# Patient Record
Sex: Male | Born: 2000 | Hispanic: Yes | Marital: Single | Smoking: Never smoker
Health system: Southern US, Community
[De-identification: ages and names within clinical notes are randomized; demographics above are authoritative.]

## PROBLEM LIST (undated history)

## (undated) HISTORY — PX: APPENDECTOMY: SHX54

---
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
## 2020-04-22 IMAGING — CT CT KNEE*L* W/O CM
3 series · 12 of 33 positions shown, 14 images · non-contrast
Comparison: Plain films left knee today.

CLINICAL DATA: The patient felt a pop in the left knee today while
playing soccer with onset of pain. Initial encounter.

EXAM:
CT OF THE LEFT KNEE WITHOUT CONTRAST
TECHNIQUE: Multidetector CT imaging of the left knee was performed according to
the standard protocol. Multiplanar CT image reconstructions were
also generated.

[Series 7: ax axial st · axial · 0.32mm/px · z∈[+338,+499]mm · 4 of 155 slices shown, 5 images]
[im 24/155  soft-tissue]
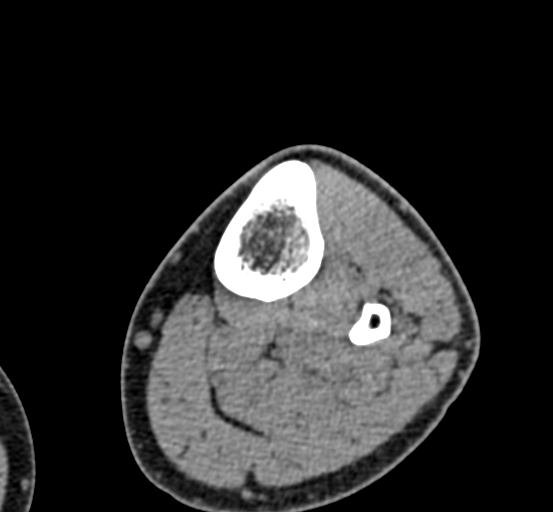
[im 24/155  bone]
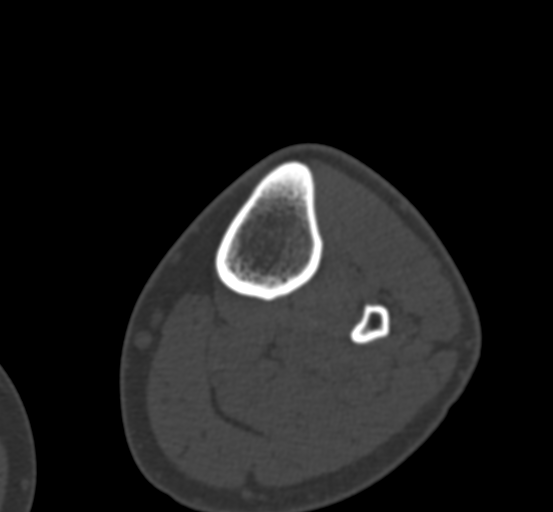
[im 60/155  bone]
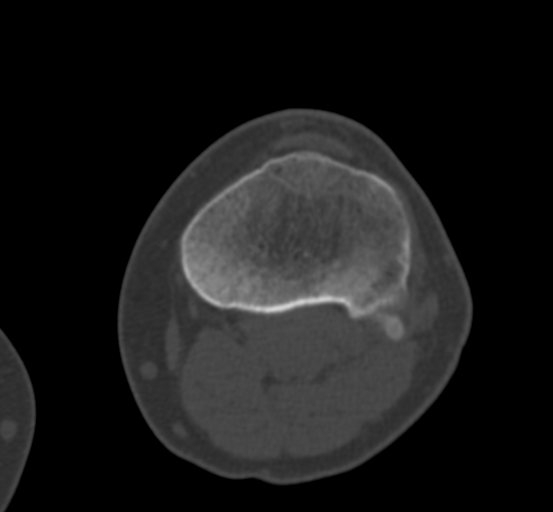
[im 95/155  bone]
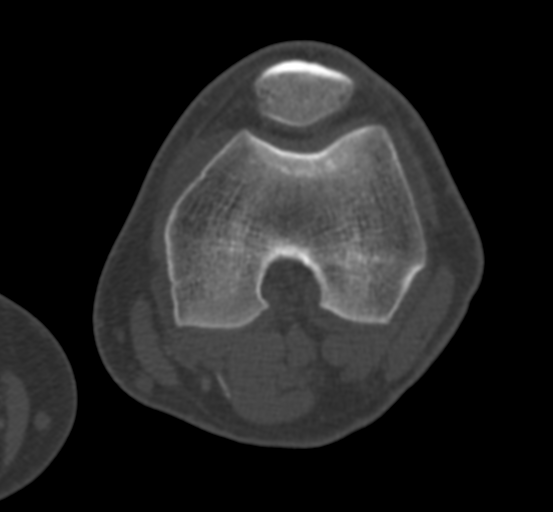
[im 131/155  bone]
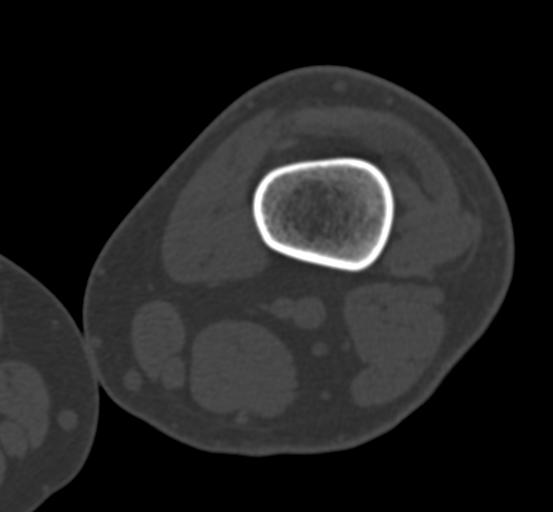

[Series 8: coronal st · coronal · 0.34mm/px · 3 of 96 slices shown]
[im 20/96  bone]
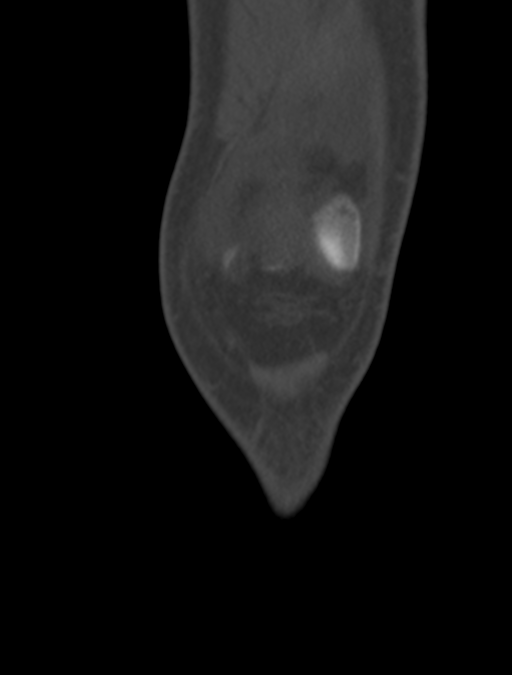
[im 39/96  bone]
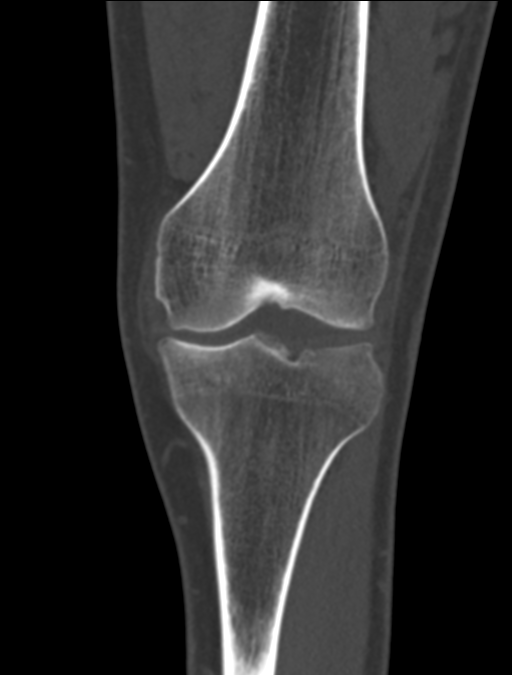
[im 58/96  bone]
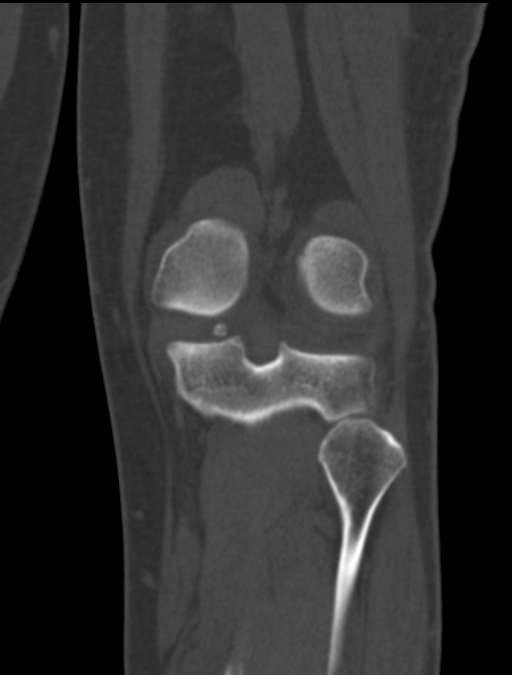

[Series 9: sagittal st · sagittal · 0.32mm/px · 5 of 77 slices shown, 6 images]
[im 26/77  bone]
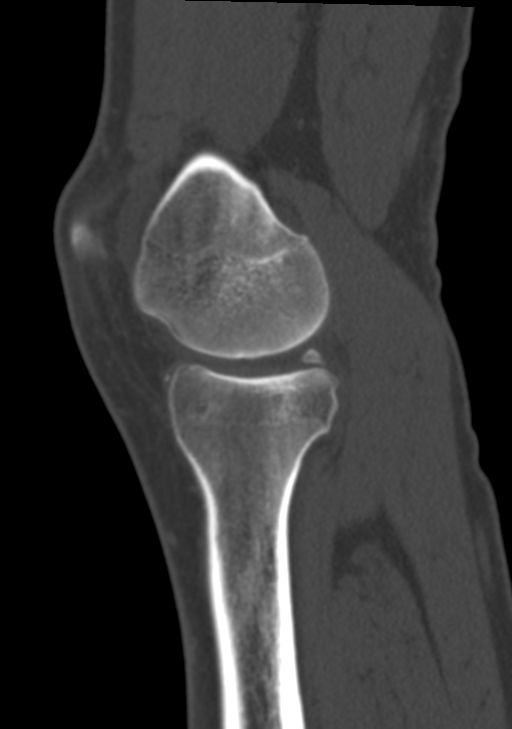
[im 32/77  bone]
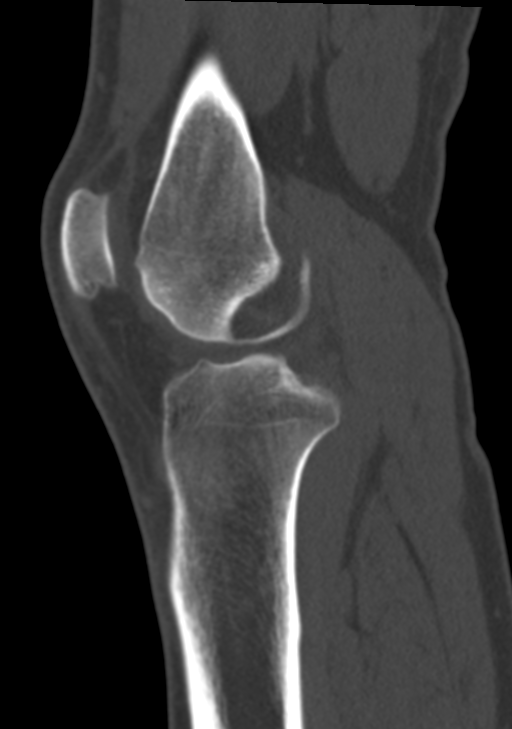
[im 39/77  soft-tissue]
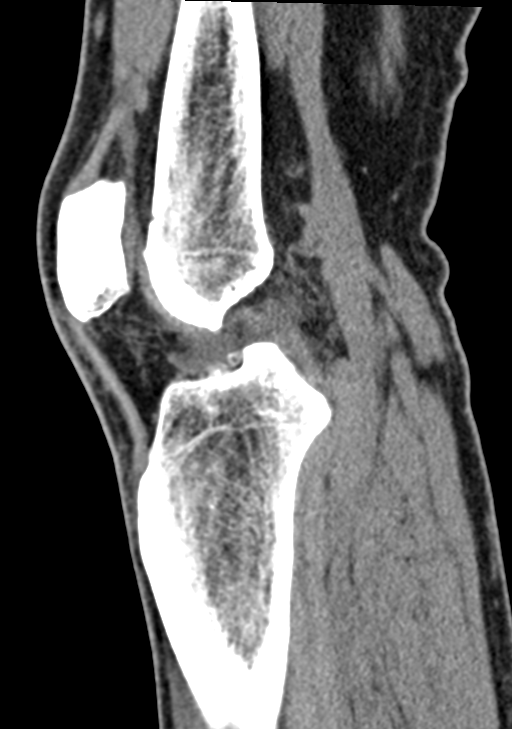
[im 39/77  bone]
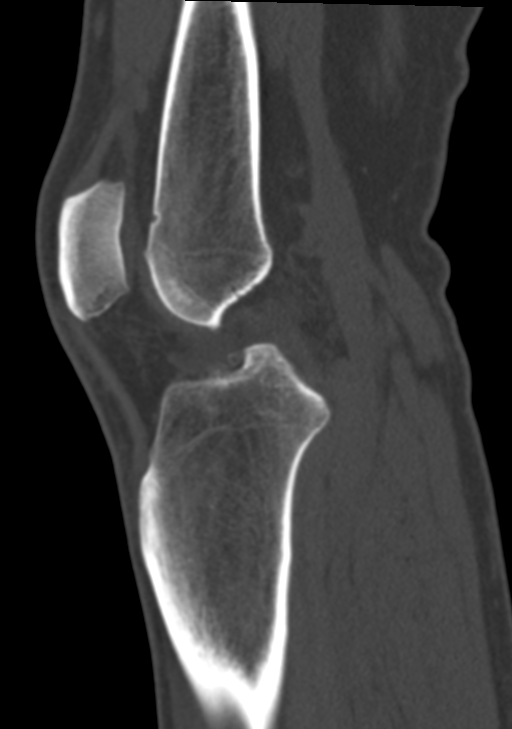
[im 45/77  bone]
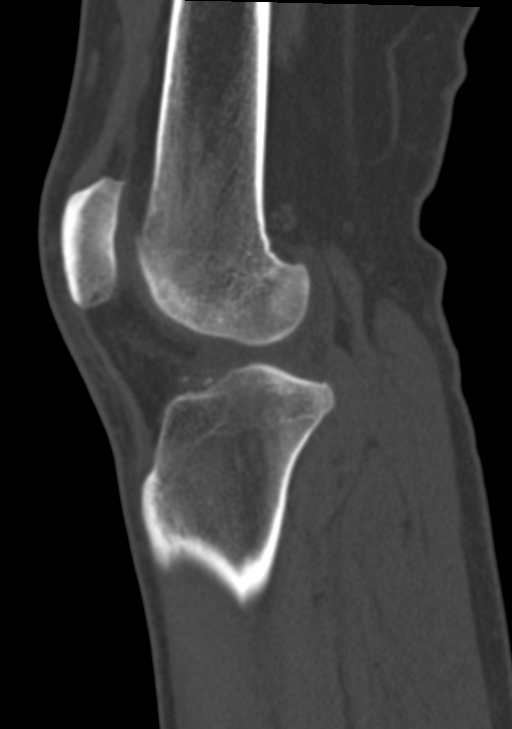
[im 51/77  bone]
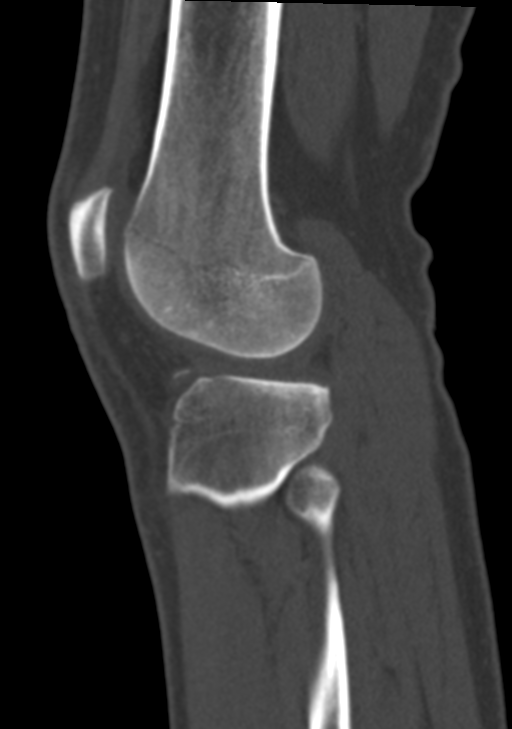

[12 of 33 positions shown; findings below may reference images not displayed]

FINDINGS: Bones/Joint/Cartilage

No fracture is identified. There is a well corticated rounded bony
structure measuring 0.4 cm in diameter in the posterior aspect of
the medial compartment at the joint line. A very thin calcification
measuring 1 cm transverse by 0.4 cm AP is seen in the anterior
aspect of the lateral compartment. Mild irregularity of subchondral
bone along the central weight-bearing medial femoral condyle may be
due to old trauma or osteophytosis.

Ligaments

Suboptimally assessed by CT. The anterior cruciate ligament is
completely torn. The posterior cruciate ligament and medial and
lateral collateral ligament complexes are intact.

Muscles and Tendons

Appear normal.

Soft tissues

Trace amount of joint fluid.
IMPRESSION: No acute fracture is identified.

Complete ACL tear appears chronic. Nonemergent MRI recommended for
further evaluation.

Small linear calcification anterior to the anterior horn of the
lateral meniscus is likely related to remote trauma. Small
ossification in the posterior horn of the medial meniscus is also
likely due to old trauma.

## 2022-06-18 ENCOUNTER — Ambulatory Visit
Admission: EM | Admit: 2022-06-18 | Discharge: 2022-06-18 | Disposition: A | Discharge status: Home / Self Care | Payer: BC Managed Care – PPO | Attending: Emergency Medicine | Admitting: Emergency Medicine

## 2022-06-18 DIAGNOSIS — J069 Acute upper respiratory infection, unspecified: Secondary | ICD-10-CM

## 2022-06-18 MED ORDER — PROMETHAZINE-DM 6.25-15 MG/5ML PO SYRP: 5.0000 mL | ORAL_SOLUTION | Freq: Four times a day (QID) | ORAL | 0 refills | Status: DC | PRN

## 2022-06-18 MED ORDER — AZITHROMYCIN 250 MG PO TABS: 250.0000 mg | ORAL_TABLET | Freq: Every day | ORAL | 0 refills | Status: DC

## 2022-06-18 MED ORDER — BENZONATATE 100 MG PO CAPS: 100.0000 mg | ORAL_CAPSULE | Freq: Three times a day (TID) | ORAL | 0 refills | Status: DC

## 2022-06-18 MED ORDER — MECLIZINE HCL 12.5 MG PO TABS: 12.5000 mg | ORAL_TABLET | Freq: Three times a day (TID) | ORAL | 0 refills | Status: DC | PRN

## 2022-06-18 NOTE — ED Provider Notes (Signed)
[facility]    CSN: [id number] Arrival date & time: 06/18/22  9485      History   Chief Complaint Chief Complaint  Patient presents with   Generalized Body Aches    HPI Tommy Ford is a 22 y.o. male.   Patient presents for evaluation of fever, nasal congestion, rhinorrhea, sore throat and a nonproductive cough beginning 7 days ago.  Has begun to experience shortness of breath with exertion, wheezing, headaches and dizziness occurring 1 day ago.  Shortness of breath is improved with rest.  Dizziness is described as feeling off balance, occurring intermittently without pattern, symptoms worse when moving around and changing positions.  Known sick contacts prior.  Decreased appetite but tolerating some food and fluids.  Has attempted use of DayQuil, NyQuil and Mucinex which have been minimally helpful.  Denies respiratory history.  No pertinent medical history.     History reviewed. No pertinent past medical history.  There are no problems to display for this patient.   Past Surgical History:  Procedure Laterality Date   APPENDECTOMY         Home Medications    Prior to Admission medications   Medication Sig Start Date End Date Taking? Authorizing Provider  ibuprofen (ADVIL,MOTRIN) 400 MG tablet Take 1 tablet (400 mg total) by mouth every 6 (six) hours as needed. 12/01/17  Yes Laban Emperor, PA-C    Family History History reviewed. No pertinent family history.  Social History Social History   Tobacco Use   Smoking status: Never   Smokeless tobacco: Never  Vaping Use   Vaping Use: Never used  Substance Use Topics   Alcohol use: No   Drug use: Never     Allergies   Patient has no known allergies.   Review of Systems Review of Systems  Constitutional:  Positive for fever. Negative for activity change, appetite change, chills, diaphoresis, fatigue and unexpected weight change.  HENT:  Positive for congestion, rhinorrhea and sore throat.  Negative for dental problem, drooling, ear discharge, ear pain, facial swelling, hearing loss, mouth sores, nosebleeds, postnasal drip, sinus pressure, sinus pain, sneezing, tinnitus, trouble swallowing and voice change.   Respiratory:  Positive for cough, shortness of breath and wheezing. Negative for apnea, choking, chest tightness and stridor.   Cardiovascular: Negative.   Gastrointestinal: Negative.   Neurological:  Positive for dizziness and headaches. Negative for tremors, seizures, syncope, facial asymmetry, speech difficulty, weakness, light-headedness and numbness.     Physical Exam Triage Vital Signs ED Triage Vitals  Enc Vitals Group     BP 06/18/22 1125 130/69     Pulse Rate 06/18/22 1125 (!) 58     Resp 06/18/22 1125 18     Temp 06/18/22 1125 98 F (36.7 C)     Temp Source 06/18/22 1125 Oral     SpO2 06/18/22 1125 100 %     Weight 06/18/22 1124 175 lb (79.4 kg)     Height 06/18/22 1124 5\' 11"  (1.803 m)     Head Circumference --      Peak Flow --      Pain Score 06/18/22 1124 5     Pain Loc --      Pain Edu? --      Excl. in [place]? --    No data found.  Updated Vital Signs BP 130/69 (BP Location: Left Arm)   Pulse (!) 58   Temp 98 F (36.7 C) (Oral)   Resp 18   Ht 5'  11" (1.803 m)   Wt 175 lb (79.4 kg)   SpO2 100%   BMI 24.41 kg/m   Visual Acuity Right Eye Distance:   Left Eye Distance:   Bilateral Distance:    Right Eye Near:   Left Eye Near:    Bilateral Near:     Physical Exam Constitutional:      Appearance: Normal appearance.  HENT:     Head: Normocephalic.     Right Ear: Tympanic membrane, ear canal and external ear normal.     Left Ear: Tympanic membrane, ear canal and external ear normal.     Nose: Congestion and rhinorrhea present.     Mouth/Throat:     Mouth: Mucous membranes are moist.     Pharynx: Posterior oropharyngeal erythema present.  Cardiovascular:     Rate and Rhythm: Normal rate and regular rhythm.     Pulses: Normal  pulses.     Heart sounds: Normal heart sounds.  Pulmonary:     Effort: Pulmonary effort is normal.     Breath sounds: Normal breath sounds.  Skin:    General: Skin is warm and dry.  Neurological:     Mental Status: He is alert and oriented to person, place, and time. Mental status is at baseline.  Psychiatric:        Mood and Affect: Mood normal.        Behavior: Behavior normal.    UC Treatments / Results  Labs (all labs ordered are listed, but only abnormal results are displayed) Labs Reviewed - No data to display  EKG   Radiology No results found.  Procedures Procedures (including critical care time)  Medications Ordered in UC Medications - No data to display  Initial Impression / Assessment and Plan / UC Course  I have reviewed the triage vital signs and the nursing notes.  Pertinent labs & imaging results that were available during my care of the patient were reviewed by me and considered in my medical decision making (see chart for details).  Viral URI with cough  Patient is in no signs of distress nor toxic appearing.  Vital signs are stable.  Low suspicion for pneumonia, pneumothorax or bronchitis and therefore will defer imaging.  Viral testing deferred due to timeline of illness.  Watch wait antibiotic placed at pharmacy past day 10 of illness if no improvement seen, in the meantime prescribed Tessalon and Promethazine DM and meclizine for management of symptoms, advised changing positions slowly for management of dizziness.mMay use additional over-the-counter medications as needed for supportive care.  May follow-up with urgent care as needed if symptoms persist or worsen.  Note given.   Final Clinical Impressions(s) / UC Diagnoses   Final diagnoses:  None   Discharge Instructions   None    ED Prescriptions   None    PDMP not reviewed this encounter.   Hans Eden, NP 06/18/22 1151

## 2022-06-18 NOTE — Discharge Instructions (Signed)
Your symptoms today are most likely being caused by a virus and should steadily improve in time it can take up to 7 to 10 days before you truly start to see a turnaround however things will get better if no improvement seen by January 7 you may pick up azithromycin from the pharmacy to provide coverage for bacteria  In the meantime you may use Tessalon every 8 hours to help calm your coughing  You may use Promethazine DM cough syrup every 6 hours as needed for additional comfort, be mindful this may make you drowsy  Dizziness is most likely a result of congestion and should improve with time, in the meantime you may use meclizine every 8 hours to help with symptom, change positions slowly waiting at least 10 seconds between movements to help stabilize the body    You can take Tylenol and/or Ibuprofen as needed for fever reduction and pain relief.   For cough: honey 1/2 to 1 teaspoon (you can dilute the honey in water or another fluid).  You can also use guaifenesin and dextromethorphan for cough. You can use a humidifier for chest congestion and cough.  If you don't have a humidifier, you can sit in the bathroom with the hot shower running.      For sore throat: try warm salt water gargles, cepacol lozenges, throat spray, warm tea or water with lemon/honey, popsicles or ice, or OTC cold relief medicine for throat discomfort.   For congestion: take a daily anti-histamine like Zyrtec, Claritin, and a oral decongestant, such as pseudoephedrine.  You can also use Flonase 1-2 sprays in each nostril daily.   It is important to stay hydrated: drink plenty of fluids (water, gatorade/powerade/pedialyte, juices, or teas) to keep your throat moisturized and help further relieve irritation/discomfort.

## 2022-06-18 NOTE — ED Triage Notes (Signed)
Pt c/o cough, nausea, dizziness, fever x1-2weeks  Pt states that he wakes up with a fever and has been taking OTC dayquil for symptoms.  Pt states that his symptoms changed and had tingling along his face, body aches, nausea, and SOB starting last night.

## 2023-05-27 ENCOUNTER — Ambulatory Visit
Admission: EM | Admit: 2023-05-27 | Discharge: 2023-05-27 | Disposition: A | Discharge status: Home / Self Care | Payer: BLUE CROSS/BLUE SHIELD | Attending: Family Medicine | Admitting: Family Medicine

## 2023-05-27 DIAGNOSIS — B351 Tinea unguium: Secondary | ICD-10-CM

## 2023-05-27 DIAGNOSIS — B353 Tinea pedis: Secondary | ICD-10-CM

## 2023-05-27 MED ORDER — CICLOPIROX 8 % EX SOLN: Freq: Every day | CUTANEOUS | 0 refills | Status: DC

## 2023-05-27 MED ORDER — KETOCONAZOLE 2 % EX CREA: TOPICAL_CREAM | CUTANEOUS | 0 refills | Status: DC

## 2023-05-27 NOTE — ED Provider Notes (Signed)
[facility]    CSN: [id number] Arrival date & time: 05/27/23  0913      History   Chief Complaint Chief Complaint  Patient presents with   Tinea Pedis    HPI Tosh Danesh is a 22 y.o. male.   HPI  Aldous presents for right foot rash that began after wearing new shoes 2 weeks ago. The rash has gotten worse. He been applying some antifungal infection. His shoes are wet and have been sitting in his car.   Sesar has otherwise been well and has no other concerns.     History reviewed. No pertinent past medical history.  There are no problems to display for this patient.   Past Surgical History:  Procedure Laterality Date   APPENDECTOMY         Home Medications    Prior to Admission medications   Medication Sig Start Date End Date Taking? Authorizing Provider  ciclopirox (PENLAC) 8 % solution Apply topically at bedtime. Apply over nail and surrounding skin. Apply daily over previous coat. After seven (7) days, may remove with alcohol and continue cycle. 05/27/23  Yes Melea Prezioso, Seward Meth, DO  ketoconazole (NIZORAL) 2 % cream Apply 2% cream once daily for 6 weeks 05/27/23  Yes Katha Cabal, DO    Family History History reviewed. No pertinent family history.  Social History Social History   Tobacco Use   Smoking status: Never   Smokeless tobacco: Never  Vaping Use   Vaping status: Never Used  Substance Use Topics   Alcohol use: No   Drug use: Never     Allergies   Patient has no known allergies.   Review of Systems Review of Systems :negative unless otherwise stated in HPI.      Physical Exam Triage Vital Signs ED Triage Vitals  Encounter Vitals Group     BP 05/27/23 0942 128/66     Systolic BP Percentile --      Diastolic BP Percentile --      Pulse Rate 05/27/23 0942 (!) 58     Resp 05/27/23 0942 16     Temp 05/27/23 0942 98.1 F (36.7 C)     Temp Source 05/27/23 0942 Oral     SpO2 05/27/23 0942 99 %     Weight  05/27/23 0941 175 lb (79.4 kg)     Height 05/27/23 0941 5\' 11"  (1.803 m)     Head Circumference --      Peak Flow --      Pain Score 05/27/23 0945 0     Pain Loc --      Pain Education --      Exclude from Growth Chart --    No data found.  Updated Vital Signs BP 128/66 (BP Location: Right Arm)   Pulse (!) 58   Temp 98.1 F (36.7 C) (Oral)   Resp 16   Ht 5\' 11"  (1.803 m)   Wt 79.4 kg   SpO2 99%   BMI 24.41 kg/m   Visual Acuity Right Eye Distance:   Left Eye Distance:   Bilateral Distance:    Right Eye Near:   Left Eye Near:    Bilateral Near:     Physical Exam  GEN: well appearing male in no acute distress  CVS: well perfused  RESP: speaking in full sentences without pause, no respiratory distress  SKIN: warm and dry; erythematous scaly patch on little toe extending toward distal 4th metatarsal, has similar lesion on his left distal  1st metatarsal, no discharge or induration; onychomycosis of right little toe with some involvement of the great toe as well     UC Treatments / Results  Labs (all labs ordered are listed, but only abnormal results are displayed) Labs Reviewed - No data to display  EKG   Radiology No results found.  Procedures Procedures (including critical care time)  Medications Ordered in UC Medications - No data to display  Initial Impression / Assessment and Plan / UC Course  I have reviewed the triage vital signs and the nursing notes.  Pertinent labs & imaging results that were available during my care of the patient were reviewed by me and considered in my medical decision making (see chart for details).     Patient is a 22 y.o. malewho presents for right foot rash.  Overall, patient is well-appearing and well-hydrated.  Vital signs stable.  Naron is afebrile.  Exam concerning for tinea pedis with onychomycosis.  Treat with topical antifungal (ketoconazole) with topical ciclopirox solution for his toenails.  Unable to perform skin  scraping here for Lamisil treatment.  No sign of bacterial infection to suggest antibiotics at this time.  Doesn't appear to be contact dermatitis.   Reviewed expectations regarding course of current medical issues.  All questions asked were answered.  Outlined signs and symptoms indicating need for more acute intervention. Patient verbalized understanding. After Visit Summary given.   Final Clinical Impressions(s) / UC Diagnoses   Final diagnoses:  Tinea pedis of both feet  Onychomycosis     Discharge Instructions      Stop by the pharmacy to pick up your prescriptions.  Follow up with your primary care provider as needed.      ED Prescriptions     Medication Sig Dispense Auth. Provider   ketoconazole (NIZORAL) 2 % cream Apply 2% cream once daily for 6 weeks 15 g Bryan Omura, DO   ciclopirox (PENLAC) 8 % solution Apply topically at bedtime. Apply over nail and surrounding skin. Apply daily over previous coat. After seven (7) days, may remove with alcohol and continue cycle. 6.6 mL Katha Cabal, DO      PDMP not reviewed this encounter.              Katha Cabal, DO 05/27/23 1327

## 2023-05-27 NOTE — ED Triage Notes (Addendum)
Pt c/o rash in R foot x2 wks. States started wearing new shoes for work & developed some pain in toe & rash.

## 2023-05-27 NOTE — Discharge Instructions (Signed)
Stop by the pharmacy to pick up your prescriptions.  Follow up with your primary care provider as needed.  

## 2023-07-17 ENCOUNTER — Encounter: Payer: Self-pay | Admitting: Emergency Medicine

## 2023-07-17 ENCOUNTER — Ambulatory Visit
Admission: EM | Admit: 2023-07-17 | Discharge: 2023-07-17 | Disposition: A | Discharge status: Home / Self Care | Payer: BC Managed Care – PPO | Attending: Physician Assistant | Admitting: Physician Assistant

## 2023-07-17 DIAGNOSIS — J101 Influenza due to other identified influenza virus with other respiratory manifestations: Secondary | ICD-10-CM | POA: Diagnosis present

## 2023-07-17 DIAGNOSIS — R051 Acute cough: Secondary | ICD-10-CM | POA: Insufficient documentation

## 2023-07-17 LAB — GROUP A STREP BY PCR: Group A Strep by PCR: NOT DETECTED

## 2023-07-17 LAB — RESP PANEL BY RT-PCR (FLU A&B, COVID) ARPGX2
Influenza A by PCR: POSITIVE — AB
Influenza B by PCR: NEGATIVE
SARS Coronavirus 2 by RT PCR: NEGATIVE

## 2023-07-17 MED ORDER — PROMETHAZINE-DM 6.25-15 MG/5ML PO SYRP: 5.0000 mL | ORAL_SOLUTION | Freq: Four times a day (QID) | ORAL | 0 refills | Status: DC | PRN

## 2023-07-17 NOTE — ED Provider Notes (Signed)
[facility]    CSN: [id number] Arrival date & time: 07/17/23  1215      History   Chief Complaint Chief Complaint  Patient presents with   Headache   Cough   Sore Throat   Nasal Congestion   Generalized Body Aches    HPI Tommy Ford is a 23 y.o. male presenting for 2-day history of fever, fatigue, cough, congestion, aches, sore throat and bodyaches.  No breathing difficulty wheezing, vomiting or diarrhea.  Has been around sick contacts at work.  Has been taking DayQuil and ibuprofen at home.  No other concerns.  HPI  History reviewed. No pertinent past medical history.  There are no active problems to display for this patient.   Past Surgical History:  Procedure Laterality Date   APPENDECTOMY         Home Medications    Prior to Admission medications   Medication Sig Start Date End Date Taking? Authorizing Provider  promethazine-dextromethorphan (PROMETHAZINE-DM) 6.25-15 MG/5ML syrup Take 5 mLs by mouth 4 (four) times daily as needed. 07/17/23  Yes Shirlee Latch, PA-C  ketoconazole (NIZORAL) 2 % cream Apply 2% cream once daily for 6 weeks 05/27/23   Katha Cabal, DO    Family History No family history on file.  Social History Social History   Tobacco Use   Smoking status: Never   Smokeless tobacco: Never  Vaping Use   Vaping status: Never Used  Substance Use Topics   Alcohol use: No   Drug use: Never     Allergies   Patient has no known allergies.   Review of Systems Review of Systems  Constitutional:  Positive for fatigue and fever.  HENT:  Positive for congestion, rhinorrhea and sore throat. Negative for sinus pressure and sinus pain.   Respiratory:  Positive for cough and chest tightness. Negative for shortness of breath.   Cardiovascular:  Negative for chest pain.  Gastrointestinal:  Negative for abdominal pain, diarrhea, nausea and vomiting.  Musculoskeletal:  Positive for myalgias.  Neurological:  Positive for  headaches. Negative for weakness and light-headedness.  Hematological:  Negative for adenopathy.     Physical Exam Triage Vital Signs ED Triage Vitals  Encounter Vitals Group     BP      Systolic BP Percentile      Diastolic BP Percentile      Pulse      Resp      Temp      Temp src      SpO2      Weight      Height      Head Circumference      Peak Flow      Pain Score      Pain Loc      Pain Education      Exclude from Growth Chart    No data found.  Updated Vital Signs BP 122/71 (BP Location: Left Arm)   Pulse 80   Temp 99.6 F (37.6 C) (Oral)   Resp 18   SpO2 97%     Physical Exam Vitals and nursing note reviewed.  Constitutional:      General: He is not in acute distress.    Appearance: Normal appearance. He is well-developed. He is not ill-appearing.  HENT:     Head: Normocephalic and atraumatic.     Nose: Congestion present.     Mouth/Throat:     Mouth: Mucous membranes are moist.     Pharynx: Oropharynx  is clear. Posterior oropharyngeal erythema present.  Eyes:     General: No scleral icterus.    Conjunctiva/sclera: Conjunctivae normal.  Cardiovascular:     Rate and Rhythm: Normal rate and regular rhythm.  Pulmonary:     Effort: Pulmonary effort is normal. No respiratory distress.     Breath sounds: Normal breath sounds.  Musculoskeletal:     Cervical back: Neck supple.  Skin:    General: Skin is warm and dry.     Capillary Refill: Capillary refill takes less than 2 seconds.  Neurological:     General: No focal deficit present.     Mental Status: He is alert. Mental status is at baseline.     Motor: No weakness.     Gait: Gait normal.  Psychiatric:        Mood and Affect: Mood normal.        Behavior: Behavior normal.      UC Treatments / Results  Labs (all labs ordered are listed, but only abnormal results are displayed) Labs Reviewed  RESP PANEL BY RT-PCR (FLU A&B, COVID) ARPGX2 - Abnormal; Notable for the following components:       Result Value   Influenza A by PCR POSITIVE (*)    All other components within normal limits  GROUP A STREP BY PCR    EKG   Radiology No results found.  Procedures Procedures (including critical care time)  Medications Ordered in UC Medications - No data to display  Initial Impression / Assessment and Plan / UC Course  I have reviewed the triage vital signs and the nursing notes.  Pertinent labs & imaging results that were available during my care of the patient were reviewed by me and considered in my medical decision making (see chart for details).   23 year old male presents for 2-day history of fever, fatigue, headaches and bodyaches, cough, congestion and sore throat.  Vitals are stable and normal and the patient is overall well-appearing.  Has nasal congestion and erythema of posterior pharynx on exam.  Chest clear.  Heart regular rate and rhythm.  Respiratory panel obtained.  Positive flu A.  Reviewed results with patient.  Patient declines Tamiflu.  Review current CDC guidelines, isolation protocol and ED precautions for flu.  Sent Promethazine DM to pharmacy.  Work note given.   Final Clinical Impressions(s) / UC Diagnoses   Final diagnoses:  Influenza A  Acute cough     Discharge Instructions      - Flu is positive.  - Sent cough medicine  - You need to isolate until you are fever free for 24 hours and symptoms are improving. - Increase rest and fluids. - You should be seen again if you have uncontrolled fever, weakness or worsening breathing problem.     ED Prescriptions     Medication Sig Dispense Auth. Provider   promethazine-dextromethorphan (PROMETHAZINE-DM) 6.25-15 MG/5ML syrup Take 5 mLs by mouth 4 (four) times daily as needed. 118 mL Shirlee Latch, PA-C      PDMP not reviewed this encounter.   Shirlee Latch, PA-C 07/17/23 1400

## 2023-07-17 NOTE — ED Triage Notes (Signed)
Patient presents with c/o non productive cough, nasal congestion, headache, sore throat, and body aches x 2 days. Patient states he is using day quill and ibuprofen at home.

## 2023-07-17 NOTE — Discharge Instructions (Signed)
-   Flu is positive.  - Sent cough medicine  - You need to isolate until you are fever free for 24 hours and symptoms are improving. - Increase rest and fluids. - You should be seen again if you have uncontrolled fever, weakness or worsening breathing problem.

## 2024-04-04 ENCOUNTER — Ambulatory Visit
Admission: EM | Admit: 2024-04-04 | Discharge: 2024-04-04 | Disposition: A | Discharge status: Home / Self Care | Attending: Emergency Medicine | Admitting: Emergency Medicine

## 2024-04-04 ENCOUNTER — Ambulatory Visit

## 2024-04-04 DIAGNOSIS — S6702XA Crushing injury of left thumb, initial encounter: Secondary | ICD-10-CM

## 2024-04-04 DIAGNOSIS — S6701XA Crushing injury of right thumb, initial encounter: Secondary | ICD-10-CM

## 2024-04-04 DIAGNOSIS — S60112A Contusion of left thumb with damage to nail, initial encounter: Secondary | ICD-10-CM

## 2024-04-04 MED ORDER — IBUPROFEN 600 MG PO TABS
600.0000 mg | ORAL_TABLET | Freq: Once | ORAL | Status: AC
  Administered 2024-04-04: 600 mg via ORAL

## 2024-04-04 MED ORDER — IBUPROFEN 600 MG PO TABS: 600.0000 mg | ORAL_TABLET | Freq: Four times a day (QID) | ORAL | 0 refills | Status: AC | PRN

## 2024-04-04 MED ORDER — ACETAMINOPHEN 325 MG PO TABS
975.0000 mg | ORAL_TABLET | Freq: Once | ORAL | Status: AC
  Administered 2024-04-04: 975 mg via ORAL

## 2024-04-04 NOTE — ED Triage Notes (Signed)
 Pt c/o R thumb pain that occurred this AM. States closed door on it. No OTC meds.

## 2024-04-04 NOTE — ED Provider Notes (Signed)
 HPI  SUBJECTIVE:  Tommy Ford is a right-handed 23 y.o. male who presents with subungual hematoma under his left thumbnail, throbbing, constant pain after crushing his left thumb in a car door earlier today.  No numbness or tingling, limitation of motion.  He states the subungual hematoma is getting larger.  He tried ice and elevation with improvement in his symptoms.  Symptoms are worse when he holds his hand in a dependent position.  He has no past medical history.  PCP: None.    History reviewed. No pertinent past medical history.  Past Surgical History:  Procedure Laterality Date   APPENDECTOMY      History reviewed. No pertinent family history.  Social History   Tobacco Use   Smoking status: Never   Smokeless tobacco: Never  Vaping Use   Vaping status: Never Used  Substance Use Topics   Alcohol use: No   Drug use: Never    No current facility-administered medications for this encounter.  Current Outpatient Medications:    ibuprofen  (ADVIL ) 600 MG tablet, Take 1 tablet (600 mg total) by mouth every 6 (six) hours as needed., Disp: 30 tablet, Rfl: 0  No Known Allergies   ROS  As noted in HPI.   Physical Exam  BP (!) 117/92 (BP Location: Left Arm)   Pulse (!) 58   Temp 98 F (36.7 C) (Oral)   Resp 16   Ht 5' 11 (1.803 m)   Wt 83.9 kg   SpO2 100%   BMI 25.80 kg/m   Constitutional: Well developed, well nourished, no acute distress Eyes:  EOMI, conjunctiva normal bilaterally HENT: Normocephalic, atraumatic,mucus membranes moist Respiratory: Normal inspiratory effort Cardiovascular: Normal rate GI: nondistended skin: See MSK exam Musculoskeletal: Distal phalanx left thumb tender.  Tenderness over the nail with subungual hematoma.  2 point discrimination intact.  Flexion/extension intact at the IP, MCP joint.  Patient IP joint stable on varus/valgus stress.  Neurologic: Alert & oriented x 3, no focal neuro deficits Psychiatric: Speech and behavior  appropriate   ED Course   Medications  acetaminophen (TYLENOL) tablet 975 mg (975 mg Oral Given 04/04/24 1457)  ibuprofen  (ADVIL ) tablet 600 mg (600 mg Oral Given 04/04/24 1457)    Orders Placed This Encounter  Procedures   DG Finger Thumb Right    Standing Status:   Standing    Number of Occurrences:   1    Reason for Exam (SYMPTOM  OR DIAGNOSIS REQUIRED):   Crush injury to distal thumb.  Subungual hematoma.  Rule out fracture    No results found for this or any previous visit (from the past 24 hours). DG Finger Thumb Right Result Date: 04/04/2024 CLINICAL DATA:  Crush injury subungual hematoma EXAM: RIGHT THUMB 2+V COMPARISON:  None Available. FINDINGS: There is no evidence of fracture or dislocation. There is no evidence of arthropathy or other focal bone abnormality. Soft tissues are unremarkable. IMPRESSION: Negative. Electronically Signed   By: Luke Bun M.D.   On: 04/04/2024 15:26    ED Clinical Impression  1. Subungual hematoma of left thumb, initial encounter   2. Crushing injury of left thumb, initial encounter      ED Assessment/Plan     Patient presents with a crush injury to his left thumb.  Will x-ray to rule out tuft fracture.  Discussed the option of trephination with patient.  He would like to proceed with this.  Procedure note: After obtaining verbal consent, had patient wash his hands thoroughly.  Then  cleaned nail with alcohol and chlorhexidine.  Using a Bovie pen, trephinated the nail and expressed blood with significant improvement in his symptoms.  Band-Aid placed.  Patient tolerated procedure well.  Reviewed imaging independently.  No fracture.  Formal radiology overread pending.  Will contact patient if overread differs enough from mine and we need to change management.   Reviewed radiology report.  No fracture consistent with my read.  See radiology report for full details.  Home with Tylenol, ibuprofen , ice.  Keep trephination site clean with  soap and water and covered for the next several days.  Follow-up here for any signs of infection.  Will provide primary care list for routine care.  Work note  Discussed  imaging, MDM, treatment plan, and plan for follow-up with patient. Discussed sn/sx that should prompt return to the ED. he agrees with plan.   Meds ordered this encounter  Medications   acetaminophen (TYLENOL) tablet 975 mg   ibuprofen  (ADVIL ) tablet 600 mg   ibuprofen  (ADVIL ) 600 MG tablet    Sig: Take 1 tablet (600 mg total) by mouth every 6 (six) hours as needed.    Dispense:  30 tablet    Refill:  0      *This clinic note was created using Scientist, clinical (histocompatibility and immunogenetics). Therefore, there may be occasional mistakes despite careful proofreading.  ?    Van Knee, MD 04/05/24 1452

## 2024-04-04 NOTE — Discharge Instructions (Addendum)
 We have trephinated your nail to help with the symptoms.  Keep this clean with soap and water and cover with a Band-Aid for the next several days.  Return for any signs of infection  I did not appreciate any fracture on your x-ray.  We will contact you if the radiology overread differs in the from mine and we need to change management.  Here is a list of primary care providers who are taking new patients:  [facility] Dr. Selinda Ku (sports medicine) Dr. Harlene Duos Rolan Hoyle, PA Dr. Mackey Ny Dr. Harlene Saddler [address] [place] [address] [phone]  [facility] [address] [place], [place] [address] [phone]  [facility] [address] [place] [address]  [phone]  [facility] [address] [place], [place] [address] [phone]  [facility] [address] [place]  [phone] [place], [place] [address]  Here are clinics/ other resources who will see you if you do not have insurance. Some have certain criteria that you must meet. Call them and find out what they are:  [facility]: [address], [place], [place] [address] Phone: [phone] Hours: First and Third Saturdays of each Month, 9 a.m. - 1 p.m.  [facility]: [address], [address] [place], [place] [address] Phone: [phone] Hours: Tuesday, 4 p.m. - 8 p.m. Thursday, 1 p.m. - 8 p.m. Wednesday, 9 a.m. - [facility] [address], [place], [place] [address] Phone: [phone] Pharmacy Phone Number: [phone] Dental Phone Number: [phone] [facility] Insurance Help: [phone]  Dental Hours: Monday - Thursday, 8 a.m. - 6 p.m.  [facility] [address], [place], [place] [address] Phone: [phone] Pharmacy Phone Number: [phone] [facility] Insurance Help: [phone]  [facility] [address] [place] [place],  [place] [address] Phone: [phone] Pharmacy Phone Number: [phone] [facility] Insurance Help: [phone]  [facility] [address] [place], [place] [address] Phone: [phone] [facility] Insurance Help: [phone]   [facility] [address], [place], [place] [address] Phone: [phone]  Go to www.goodrx.com  or www.costplusdrugs.com to look up your medications. This will give you a list of where you can find your prescriptions at the most affordable prices. Or ask the pharmacist what the cash price is, or if they have any other discount programs available to help make your medication more affordable. This can be less expensive than what you would pay with insurance.     Apply ice to the area for 10 minutes at a time.  600 mg ibuprofen , 1000 mg of Tylenol together 3-4 times a day as needed for pain.

## 2024-06-15 ENCOUNTER — Other Ambulatory Visit: Payer: Self-pay

## 2024-06-15 ENCOUNTER — Emergency Department
Admission: EM | Admit: 2024-06-15 | Discharge: 2024-06-15 | Disposition: A | Discharge status: Home / Self Care | Attending: Emergency Medicine | Admitting: Emergency Medicine

## 2024-06-15 DIAGNOSIS — Z23 Encounter for immunization: Secondary | ICD-10-CM | POA: Diagnosis not present

## 2024-06-15 DIAGNOSIS — W272XXA Contact with scissors, initial encounter: Secondary | ICD-10-CM | POA: Insufficient documentation

## 2024-06-15 DIAGNOSIS — S81812A Laceration without foreign body, left lower leg, initial encounter: Secondary | ICD-10-CM | POA: Insufficient documentation

## 2024-06-15 DIAGNOSIS — S8992XA Unspecified injury of left lower leg, initial encounter: Secondary | ICD-10-CM | POA: Diagnosis present

## 2024-06-15 MED ORDER — TETANUS-DIPHTH-ACELL PERTUSSIS 5-2-15.5 LF-MCG/0.5 IM SUSP
0.5000 mL | Freq: Once | INTRAMUSCULAR | Status: AC
  Administered 2024-06-15: 0.5 mL via INTRAMUSCULAR
  Filled 2024-06-15: qty 0.5

## 2024-06-15 NOTE — Discharge Instructions (Addendum)
 The Steri-Strips will begin to fall off on their own in about a week.  You can bathe like normal. Watch for signs of infection including redness, warmth, swelling, pain and pus drainage.  If you develop any of these please return to the ED, urgent care or your primary care provider.

## 2024-06-15 NOTE — ED Provider Notes (Signed)
 "  [facility] Provider Note    Event Date/Time   First MD Initiated Contact with Patient 06/15/24 2154     (approximate)   History   Laceration   HPI  TRUE Garciamartinez is a 23 y.o. male with no PMH who presents for evaluation of a laceration to the left leg.  Patient states he had a pair of scissors that was in a book bag that scraped his leg as he was taking his backpack off.  He is not sure when his last tetanus shot was.      Physical Exam   Triage Vital Signs: ED Triage Vitals  Encounter Vitals Group     BP 06/15/24 2145 132/72     Girls Systolic BP Percentile --      Girls Diastolic BP Percentile --      Boys Systolic BP Percentile --      Boys Diastolic BP Percentile --      Pulse Rate 06/15/24 2145 61     Resp 06/15/24 2145 18     Temp 06/15/24 2145 98 F (36.7 C)     Temp src --      SpO2 06/15/24 2145 98 %     Weight 06/15/24 2144 185 lb (83.9 kg)     Height 06/15/24 2144 5' 11 (1.803 m)     Head Circumference --      Peak Flow --      Pain Score 06/15/24 2144 3     Pain Loc --      Pain Education --      Exclude from Growth Chart --     Most recent vital signs: Vitals:   06/15/24 2145  BP: 132/72  Pulse: 61  Resp: 18  Temp: 98 F (36.7 C)  SpO2: 98%   General: Awake, no distress.  CV:  Good peripheral perfusion.  Resp:  Normal effort.  Abd:  No distention.  Other:  There is a scratch approximately 5 cm long on the upper lateral left thigh and a second scratch approximately 3 to 4 cm long with the laceration at the very end on the lower lateral left thigh, laceration is approximately 1-1/2 cm long, superficial and not bleeding.   ED Results / Procedures / Treatments   Labs (all labs ordered are listed, but only abnormal results are displayed) Labs Reviewed - No data to display   PROCEDURES:  Critical Care performed: No  Procedures   MEDICATIONS ORDERED IN ED: Medications  Tdap (ADACEL) injection 0.5  mL (0.5 mLs Intramuscular Given 06/15/24 2218)     IMPRESSION / MDM / ASSESSMENT AND PLAN / ED COURSE  I reviewed the triage vital signs and the nursing notes.                             23 year old male presents for evaluation of a laceration to the left leg.  Vital signs are stable patient NAD on exam.  Differential diagnosis includes, but is not limited to, laceration, tetanus prophylaxis.  Patient's presentation is most consistent with acute, uncomplicated illness.  Laceration is superficial, well-approximated and is not actively bleeding.  Wound was cleaned using saline and closed with Steri-Strips.  Patient was advised on wound care.  His tetanus shot was updated today.  He voiced understanding, all questions were answered and he was stable at discharge.    FINAL CLINICAL IMPRESSION(S) / ED DIAGNOSES   Final diagnoses:  Laceration of left lower extremity, initial encounter     Rx / DC Orders   ED Discharge Orders     None        Note:  This document was prepared using Dragon voice recognition software and may include unintentional dictation errors.   Cleaster Tinnie LABOR, PA-C 06/15/24 2224    Floy Roberts, MD 06/15/24 2303  "

## 2024-06-15 NOTE — ED Triage Notes (Signed)
 Pt presents for left leg laceration on a pair of scissors. Bleeding controlled. Unknown TDAP
